# Patient Record
Sex: Male | Born: 1970 | Race: White | Hispanic: No | Marital: Married | Smoking: Never smoker
Health system: Southern US, Community
[De-identification: ages and names within clinical notes are randomized; demographics above are authoritative.]

---

## 2017-02-24 ENCOUNTER — Ambulatory Visit (INDEPENDENT_AMBULATORY_CARE_PROVIDER_SITE_OTHER): Payer: 59 | Admitting: Podiatry

## 2017-02-24 ENCOUNTER — Ambulatory Visit (INDEPENDENT_AMBULATORY_CARE_PROVIDER_SITE_OTHER): Payer: 59

## 2017-02-24 DIAGNOSIS — S99929A Unspecified injury of unspecified foot, initial encounter: Secondary | ICD-10-CM

## 2017-02-24 NOTE — Progress Notes (Signed)
   Subjective:    Patient ID: Trevor RingerKarl Martin, male    DOB: [birth year], 47 y.o.   MRN: [id number]  HPI    Review of Systems  All other systems reviewed and are negative.      Objective:   Physical Exam        Assessment & Plan:

## 2017-02-26 NOTE — Progress Notes (Signed)
   HPI: 47 year old male presenting today as a new patient with a chief complaint of pain to the left forefoot that began earlier today. He reports associated swelling to the 2nd and 3rd toes of the left foot. He states it feels as if he is walking on a marble. He has not done anything to treat the symptoms. Patient is here for further evaluation and treatment.   No past medical history on file.    Physical Exam: General: The patient is alert and oriented x3 in no acute distress.  Dermatology: Skin is warm, dry and supple bilateral lower extremities. Negative for open lesions or macerations.  Vascular: Palpable pedal pulses bilaterally. No edema or erythema noted. Capillary refill within normal limits.  Neurological: Epicritic and protective threshold grossly intact bilaterally.   Musculoskeletal Exam: Sharp pain with palpation of the 2nd interspace and lateral compression of the metatarsal heads consistent with neuroma.  Positive Conley Canal sign with loadbearing of the forefoot.  Radiographic Exam:  Normal osseous mineralization. Joint spaces preserved. No fracture/dislocation/boney destruction.    Assessment: 1.  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 Patient was evaluated. X-Rays reviewed. 2. Patient declined injection today. 3. Met pads dispensed.  4. Recommended OTC Motrin as needed.  5. Recommended good shoe gear. 6. Return to clinic as needed.    Edrick Kins, DPM [facility]  Dr. Edrick Kins, [address]                                        [place], [place] [address]                Office [phone]  Fax [phone]

## 2017-03-03 ENCOUNTER — Ambulatory Visit: Payer: Self-pay | Admitting: Podiatry

## 2019-09-20 ENCOUNTER — Other Ambulatory Visit: Payer: Self-pay

## 2019-09-20 ENCOUNTER — Emergency Department (HOSPITAL_BASED_OUTPATIENT_CLINIC_OR_DEPARTMENT_OTHER): Payer: No Typology Code available for payment source

## 2019-09-20 ENCOUNTER — Emergency Department (HOSPITAL_BASED_OUTPATIENT_CLINIC_OR_DEPARTMENT_OTHER)
Admission: EM | Admit: 2019-09-20 | Discharge: 2019-09-20 | Disposition: A | Discharge status: Home / Self Care | Payer: No Typology Code available for payment source | Attending: Emergency Medicine | Admitting: Emergency Medicine

## 2019-09-20 ENCOUNTER — Encounter (HOSPITAL_BASED_OUTPATIENT_CLINIC_OR_DEPARTMENT_OTHER): Payer: Self-pay | Admitting: Emergency Medicine

## 2019-09-20 DIAGNOSIS — R0789 Other chest pain: Secondary | ICD-10-CM | POA: Diagnosis not present

## 2019-09-20 DIAGNOSIS — R0602 Shortness of breath: Secondary | ICD-10-CM | POA: Insufficient documentation

## 2019-09-20 DIAGNOSIS — R079 Chest pain, unspecified: Secondary | ICD-10-CM | POA: Diagnosis present

## 2019-09-20 LAB — CBC
HCT: 44.8 % (ref 39.0–52.0)
Hemoglobin: 15.5 g/dL (ref 13.0–17.0)
MCH: 31.2 pg (ref 26.0–34.0)
MCHC: 34.6 g/dL (ref 30.0–36.0)
MCV: 90.1 fL (ref 80.0–100.0)
Platelets: 190 10*3/uL (ref 150–400)
RBC: 4.97 MIL/uL (ref 4.22–5.81)
RDW: 12.1 % (ref 11.5–15.5)
WBC: 6.8 10*3/uL (ref 4.0–10.5)
nRBC: 0 % (ref 0.0–0.2)

## 2019-09-20 LAB — BASIC METABOLIC PANEL
Anion gap: 12 (ref 5–15)
BUN: 16 mg/dL (ref 6–20)
CO2: 23 mmol/L (ref 22–32)
Calcium: 9.1 mg/dL (ref 8.9–10.3)
Chloride: 102 mmol/L (ref 98–111)
Creatinine, Ser: 0.78 mg/dL (ref 0.61–1.24)
GFR calc Af Amer: >60 mL/min (ref >60)
GFR calc non Af Amer: >60 mL/min (ref >60)
Glucose, Bld: 97 mg/dL (ref 70–99)
Potassium: 3.5 mmol/L (ref 3.5–5.1)
Sodium: 137 mmol/L (ref 135–145)

## 2019-09-20 LAB — D-DIMER, QUANTITATIVE: D-Dimer, Quant: <0.27 ug/mL-FEU (ref 0.00–0.50)

## 2019-09-20 LAB — TROPONIN I (HIGH SENSITIVITY)
Troponin I (High Sensitivity): 3 ng/L (ref <18)
Troponin I (High Sensitivity): 2 ng/L (ref <18)

## 2019-09-20 NOTE — ED Provider Notes (Signed)
[facility] EMERGENCY DEPARTMENT Provider Note   CSN: [id number] Arrival date & time: 09/20/19  0134     History Chief Complaint  Patient presents with  . Chest Pain    Trevor Martin is a 49 y.o. male.  HPI  HPI: A 49 year old patient with a history of obesity presents for evaluation of chest pain. Initial onset of pain was approximately 1-3 hours ago. The patient's chest pain is described as heaviness/pressure/tightness and is not worse with exertion. The patient's chest pain is not middle- or left-sided, is not well-localized, is not sharp and does not radiate to the arms/jaw/neck. The patient does not complain of nausea and denies diaphoresis. The patient has no history of stroke, has no history of peripheral artery disease, has not smoked in the past 90 days, denies any history of treated diabetes, has no relevant family history of coronary artery disease (first degree relative at less than age 97), is not hypertensive and has no history of hypercholesterolemia.   This a 49 year old male with no reported past medical history who presents with chest discomfort.  Patient reports onset of symptoms on Saturday.  He has had waxing and waning chest discomfort and heaviness.  He describes it as "not feeling like I can catch my breath well."  Denies any association with exertion or food intake.  Denies any recent fevers, cough, upper respiratory symptoms.  No known sick contacts or Covid exposures.  He is not vaccinated against COVID-19.  Denies history of blood clots or lower extremity swelling.  No recent travel or hospitalization.  Currently he is not having any discomfort.  History reviewed. No pertinent past medical history.  There are no problems to display for this patient.   History reviewed. No pertinent surgical history.     No family history on file.  Social History   Tobacco Use  . Smoking status: Never Smoker  . Smokeless tobacco: Never Used  Substance Use  Topics  . Alcohol use: Never  . Drug use: Never    Home Medications Prior to Admission medications   Not on File    Allergies    Patient has no allergy information on record.  Review of Systems   Review of Systems  Constitutional: Negative for fever.  Respiratory: Positive for shortness of breath. Negative for cough.   Cardiovascular: Positive for chest pain. Negative for leg swelling.  Gastrointestinal: Negative for abdominal pain, nausea and vomiting.  Genitourinary: Negative for dysuria.  All other systems reviewed and are negative.   Physical Exam Updated Vital Signs BP 125/80 (BP Location: Right Arm)   Pulse 63   Temp 97.6 F (36.4 C) (Oral)   Resp 18   Ht 1.829 m (6')   Wt 109.8 kg   SpO2 98%   BMI 32.82 kg/m   Physical Exam Vitals and nursing note reviewed.  Constitutional:      Appearance: He is well-developed. He is obese.  HENT:     Head: Normocephalic and atraumatic.  Eyes:     Pupils: Pupils are equal, round, and reactive to light.  Cardiovascular:     Rate and Rhythm: Normal rate and regular rhythm.     Heart sounds: Normal heart sounds. No murmur heard.   Pulmonary:     Effort: Pulmonary effort is normal. No respiratory distress.     Breath sounds: Normal breath sounds. No wheezing.  Abdominal:     General: Bowel sounds are normal.     Palpations: Abdomen is soft.  Tenderness: There is no abdominal tenderness. There is no rebound.  Musculoskeletal:     Cervical back: Neck supple.  Lymphadenopathy:     Cervical: No cervical adenopathy.  Skin:    General: Skin is warm and dry.  Neurological:     Mental Status: He is alert and oriented to person, place, and time.     ED Results / Procedures / Treatments   Labs (all labs ordered are listed, but only abnormal results are displayed) Labs Reviewed  BASIC METABOLIC PANEL  CBC  D-DIMER, QUANTITATIVE (NOT AT [facility])  TROPONIN I (HIGH SENSITIVITY)  TROPONIN I (HIGH SENSITIVITY)     EKG EKG Interpretation  Date/Time:  Monday September 20 2019 01:44:13 EDT Ventricular Rate:  71 PR Interval:  142 QRS Duration: 92 QT Interval:  400 QTC Calculation: 434 R Axis:   -24 Text Interpretation: Normal sinus rhythm Normal ECG Confirmed by Ross Marcus ([id number]) on 09/20/2019 3:13:42 AM   Radiology DG Chest 2 View  Result Date: 09/20/2019 CLINICAL DATA:  Chest pain. EXAM: CHEST - 2 VIEW COMPARISON:  None. FINDINGS: There is no evidence of acute infiltrate, pleural effusion or pneumothorax. A 5 mm calcified lung nodule is seen within the left apex. The heart size and mediastinal contours are within normal limits. The visualized skeletal structures are unremarkable. IMPRESSION: No active cardiopulmonary disease. Electronically Signed   By: Aram Candela M.D.   On: 09/20/2019 02:38    Procedures Procedures (including critical care time)  Medications Ordered in ED Medications - No data to display  ED Course  I have reviewed the triage vital signs and the nursing notes.  Pertinent labs & imaging results that were available during my care of the patient were reviewed by me and considered in my medical decision making (see chart for details).    MDM Rules/Calculators/A&P HEAR Score: 2                         Patient presents with chest discomfort.  He is overall nontoxic and vital signs are reassuring.  EKG shows no acute ischemic changes.  Pain is very atypical for ACS and is not exertional.  Heart score is 2.  Troponin profile is low risk.  Chest x-ray obtained and shows no evidence of pneumothorax or pneumonia.  Screening D-dimer was sent to evaluate for PE and this is within normal range.  Doubt PE.  Given prevalence of COVID-19, offered COVID-19 testing although he denies any infectious symptoms.  He has declined.  I did encourage him to consider vaccination.  Patient has remained relatively active symptomatic in the emergency department.  Recommend follow-up with  cardiology as an outpatient.  After history, exam, and medical workup I feel the patient has been appropriately medically screened and is safe for discharge home. Pertinent diagnoses were discussed with the patient. Patient was given return precautions.   Final Clinical Impression(s) / ED Diagnoses Final diagnoses:  Atypical chest pain    Rx / DC Orders ED Discharge Orders    None       Odes Lolli, Mayer Masker, MD 09/20/19 [phone]

## 2019-09-20 NOTE — ED Triage Notes (Signed)
Reports central chest pressure that started Saturday. Also endorses SOB.

## 2019-09-20 NOTE — Discharge Instructions (Addendum)
You were seen today for chest pain.  Your work-up is reassuring.  Follow-up with cardiology for definitive stress testing and further evaluation.  I encourage you to consider COVID-19 vaccination given prevalence of COVID-19 in the community.

## 2019-10-04 ENCOUNTER — Ambulatory Visit: Payer: No Typology Code available for payment source | Admitting: Family Medicine

## 2019-10-05 ENCOUNTER — Ambulatory Visit (INDEPENDENT_AMBULATORY_CARE_PROVIDER_SITE_OTHER): Payer: No Typology Code available for payment source | Admitting: Family Medicine

## 2019-10-05 ENCOUNTER — Encounter: Payer: Self-pay | Admitting: Family Medicine

## 2019-10-05 ENCOUNTER — Other Ambulatory Visit: Payer: Self-pay

## 2019-10-05 DIAGNOSIS — M25552 Pain in left hip: Secondary | ICD-10-CM

## 2019-10-05 DIAGNOSIS — M25562 Pain in left knee: Secondary | ICD-10-CM

## 2019-10-05 NOTE — Progress Notes (Signed)
Office Visit Note   Patient: Trevor Martin           Date of Birth: [birth year]           MRN: [id number] Visit Date: 10/05/2019 Requested by: No referring provider defined for this encounter. PCP: Patient, No Pcp Per  Subjective: Chief Complaint  Patient presents with  . Left Knee - Pain    Intermittent pain since July of this year - hurts especially with running. Can feel "cracking" inside the knee - does not hurt when it cracks. Pain anterior and slightly medial. Also, has left gluteal pain when running.    HPI: He is here with left knee pain.  2 or 3 years ago he recalls running and "tweaking" his knee causing him to stop running.  Around the same time he was moving to a new house, so he basically rested from running for about 3 months.  Eventually his knee felt better and he was able to resume running again but then this past July without injury, he started noticing increasing popping in his knee.  It occurs when running, and when standing up from seated position.  He does not feel a lot of pain with it, he denies any locking or giving way.  He has not done any specific treatment since it does not hurt.  He has otherwise been in good health.  Denies any back pain, but he does note some left posterior hip pain intermittently in the past couple months without radicular symptoms.               ROS:   All other systems were reviewed and are negative.  Objective: Vital Signs: There were no vitals taken for this visit.  Physical Exam:  General:  Alert and oriented, in no acute distress. Pulm:  Breathing unlabored. Psy:  Normal mood, congruent affect.  Left hip: He is slightly tender in the posterior hip along the course of the piriformis.  He has pain with piriformis stretch.  No pain with passive internal hip rotation. Left knee: Trace effusion with no warmth.  He has a palpable click in the patellofemoral joint at about 60 degrees flexion.  Lachman's is solid, no laxity with varus  or valgus stress.  He is mildly tender on the lateral joint line and has pain with a palpable click on McMurray's.  No significant medial tenderness.   Imaging: None today  Assessment & Plan: 1.  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 -We will try glucosamine, turmeric, he will wear good fitting shoes and avoid running when limping.  He will try to cross train using low impact aerobic activities. -If symptoms worsen, then we will obtain three-view knee x-rays and possibly MRI scan.  2.  Left hip pain, suspect piriformis strain secondary to altered gait -Stretching at home.  Physical therapy if worsens.     Procedures: No procedures performed  No notes on file     PMFS History: There are no problems to display for this patient.  History reviewed. No pertinent past medical history.  History reviewed. No pertinent family history.  History reviewed. No pertinent surgical history. Social History   Occupational History  . Not on file  Tobacco Use  . Smoking status: Never Smoker  . Smokeless tobacco: Never Used  Substance and Sexual Activity  . Alcohol use: Never  . Drug use: Never  . Sexual activity: Not on file

## 2019-10-05 NOTE — Patient Instructions (Signed)
    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   Treatment:  - Good shoes - No running when limping. - Flatter terrain is better - Low impact aerobic exercise  - Glucosamine sulfate 1,000-1,500 mg twice daily  - Turmeric 500 mg twice daily

## 2019-10-22 ENCOUNTER — Telehealth: Payer: Self-pay

## 2019-10-22 NOTE — Telephone Encounter (Signed)
Holding for Terri in case there is a cancellation and sooner appt available.

## 2019-10-22 NOTE — Telephone Encounter (Signed)
Patient called in to sch appt for physical . Also wanted to be put on cancellation list if anybody cancels sooner than 10/22 and also wants a 8:20 appt time

## 2019-10-25 NOTE — Telephone Encounter (Signed)
I called the patient, as we had had a cancellation for the 8:20 slot the day before his current appointment. He actually meant that he wanted an earlier appointment on that same day (the 22nd) - Fridays work best for him. Physicals are only at 8:20 or 1:00. Unfortunately, the morning appointments are booked, so the patient will still come in at 1:00.

## 2019-10-28 ENCOUNTER — Encounter: Payer: No Typology Code available for payment source | Admitting: Family Medicine

## 2019-10-29 ENCOUNTER — Telehealth: Payer: Self-pay | Admitting: Family Medicine

## 2019-10-29 ENCOUNTER — Encounter: Payer: Self-pay | Admitting: Family Medicine

## 2019-10-29 ENCOUNTER — Other Ambulatory Visit: Payer: Self-pay

## 2019-10-29 ENCOUNTER — Ambulatory Visit (INDEPENDENT_AMBULATORY_CARE_PROVIDER_SITE_OTHER): Payer: No Typology Code available for payment source | Admitting: Family Medicine

## 2019-10-29 VITALS — BP 127/77 | HR 83 | Ht 72.0 in | Wt 254.2 lb

## 2019-10-29 DIAGNOSIS — Z6834 Body mass index (BMI) 34.0-34.9, adult: Secondary | ICD-10-CM

## 2019-10-29 DIAGNOSIS — E6609 Other obesity due to excess calories: Secondary | ICD-10-CM | POA: Insufficient documentation

## 2019-10-29 DIAGNOSIS — Z0000 Encounter for general adult medical examination without abnormal findings: Secondary | ICD-10-CM

## 2019-10-29 NOTE — Telephone Encounter (Signed)
[facility] -- left this info on the patient's voice mail.

## 2019-10-29 NOTE — Progress Notes (Signed)
Office Visit Note   Patient: Trevor Martin           Date of Birth: [birth year]           MRN: [id number] Visit Date: 10/29/2019 Requested by: No referring provider defined for this encounter. PCP: Patient, No Pcp Per  Subjective: Chief Complaint  Patient presents with  . Annual Exam    HPI: He is here for a wellness exam.  He is feeling well, but it has been several years since he had one.  He has no specific complaints or concerns other than the fact that he wants to lose weight.  Although he does not eat much in the way of processed foods, his weakness is eating baked goods.  Generally everything is made in the home because his son has inflammatory bowel disease and follows the specific carbohydrate diet.  He gets occasional pain and popping in his shoulder and his knee but otherwise feels well.  Eye exam and dental exams are up-to-date.  He has not had colonoscopy.  Family history updated in detail.  His son has inflammatory bowel disease.                ROS: Energy level is good.  Bowel function is normal.  All other systems were reviewed and are negative.  Objective: Vital Signs: BP 127/77   Pulse 83   Ht 6' (1.829 m)   Wt 254 lb 3.2 oz (115.3 kg)   BMI 34.48 kg/m   Physical Exam:  General:  Alert and oriented, in no acute distress. Pulm:  Breathing unlabored. Psy:  Normal mood, congruent affect. Skin: Multiple benign-appearing nevi on his trunk. HEENT:  [place]/AT, PERRLA, EOM Full, no nystagmus.  Funduscopic examination within normal limits.  No conjunctival erythema.  Tympanic membranes are pearly gray with normal landmarks.  External ear canals are normal.  Nasal passages are clear.  Oropharynx is clear.  No significant lymphadenopathy.  No thyromegaly or nodules.  2+ carotid pulses without bruits. CV: Regular rate and rhythm without murmurs, rubs, or gallops.  No peripheral edema.  2+ radial and posterior tibial pulses. Lungs: Clear to auscultation throughout  with no wheezing or areas of consolidation. Abd: Bowel sounds are active, no hepatosplenomegaly or masses.  Soft and nontender.  No audible bruits.  No evidence of ascites.    Imaging: No results found.  Assessment & Plan: 1.  Wellness examination -Labs to evaluate. -Cologuard at age 49.  2.  Obesity - Cardiometabolic food plan given. - Health coach information given.         Procedures: No procedures performed  No notes on file     PMFS History: There are no problems to display for this patient.  History reviewed. No pertinent past medical history.  Family History  Problem Relation Age of Onset  . Healthy Mother   . Arthritis Mother        OA  . Arthritis Father        OA  . Hypertension Father   . Healthy Sister   . Hypertension Brother   . Heart disease Maternal Grandfather   . Healthy Paternal Grandmother   . Cancer Paternal Grandfather        Possible pancreatic  . Healthy Brother   . Colon cancer Neg Hx   . Prostate cancer Neg Hx   . Diabetes Neg Hx   . Stroke Neg Hx   . Alzheimer's disease Neg Hx     History reviewed.  No pertinent surgical history. Social History   Occupational History  . Not on file  Tobacco Use  . Smoking status: Never Smoker  . Smokeless tobacco: Never Used  Substance and Sexual Activity  . Alcohol use: Never  . Drug use: Never  . Sexual activity: Not on file

## 2019-10-29 NOTE — Telephone Encounter (Signed)
Patient called and wants to know the name of the company we use to process blood work. Please call patient at [phone].

## 2019-10-30 LAB — COMPREHENSIVE METABOLIC PANEL
AG Ratio: 1.7 (calc) (ref 1.0–2.5)
ALT: 31 U/L (ref 9–46)
AST: 15 U/L (ref 10–40)
Albumin: 4.5 g/dL (ref 3.6–5.1)
Alkaline phosphatase (APISO): 84 U/L (ref 36–130)
BUN: 16 mg/dL (ref 7–25)
CO2: 28 mmol/L (ref 20–32)
Calcium: 9.9 mg/dL (ref 8.6–10.3)
Chloride: 102 mmol/L (ref 98–110)
Creat: 0.86 mg/dL (ref 0.60–1.35)
Globulin: 2.6 g/dL (calc) (ref 1.9–3.7)
Glucose, Bld: 85 mg/dL (ref 65–99)
Potassium: 4.6 mmol/L (ref 3.5–5.3)
Sodium: 139 mmol/L (ref 135–146)
Total Bilirubin: 0.7 mg/dL (ref 0.2–1.2)
Total Protein: 7.1 g/dL (ref 6.1–8.1)

## 2019-10-30 LAB — CBC WITH DIFFERENTIAL/PLATELET
Absolute Monocytes: 574 cells/uL (ref 200–950)
Basophils Absolute: 33 cells/uL (ref 0–200)
Basophils Relative: 0.4 %
Eosinophils Absolute: 82 cells/uL (ref 15–500)
Eosinophils Relative: 1 %
HCT: 46 % (ref 38.5–50.0)
Hemoglobin: 15.7 g/dL (ref 13.2–17.1)
Lymphs Abs: 2116 cells/uL (ref 850–3900)
MCH: 31.3 pg (ref 27.0–33.0)
MCHC: 34.1 g/dL (ref 32.0–36.0)
MCV: 91.8 fL (ref 80.0–100.0)
MPV: 11.3 fL (ref 7.5–12.5)
Monocytes Relative: 7 %
Neutro Abs: 5396 cells/uL (ref 1500–7800)
Neutrophils Relative %: 65.8 %
Platelets: 230 10*3/uL (ref 140–400)
RBC: 5.01 10*6/uL (ref 4.20–5.80)
RDW: 12.9 % (ref 11.0–15.0)
Total Lymphocyte: 25.8 %
WBC: 8.2 10*3/uL (ref 3.8–10.8)

## 2019-10-30 LAB — VITAMIN D 25 HYDROXY (VIT D DEFICIENCY, FRACTURES): Vit D, 25-Hydroxy: 42 ng/mL (ref 30–100)

## 2019-10-30 LAB — THYROID PANEL WITH TSH
Free Thyroxine Index: 2.1 (ref 1.4–3.8)
T3 Uptake: 35 % (ref 22–35)
T4, Total: 6.1 ug/dL (ref 4.9–10.5)
TSH: 1.61 mIU/L (ref 0.40–4.50)

## 2019-10-30 LAB — LIPID PANEL
Cholesterol: 169 mg/dL (ref <200)
HDL: 47 mg/dL (ref >=40)
LDL Cholesterol (Calc): 85 mg/dL (calc)
Non-HDL Cholesterol (Calc): 122 mg/dL (calc) (ref <130)
Total CHOL/HDL Ratio: 3.6 (calc) (ref <5.0)
Triglycerides: 308 mg/dL — ABNORMAL HIGH (ref <150)

## 2019-10-30 LAB — PSA: PSA: 1.95 ng/mL (ref <=4.0)

## 2019-10-30 LAB — HIGH SENSITIVITY CRP: hs-CRP: 1.2 mg/L

## 2019-11-01 ENCOUNTER — Telehealth: Payer: Self-pay | Admitting: Family Medicine

## 2019-11-01 NOTE — Telephone Encounter (Signed)
Labs show:  Triglycerides are elevated at 308.  This number generally improves with regular exercise and dietary limitation of breads; pastas; cereals; sugars/sweets.  Would suggest rechecking in 6 months to be sure it's normalized.  Prostate PSA is normal at 1.95, but slightly higher than I'd like to see at age 49.  I recommend rechecking in 6 months to ensure stability.  All else looks good.

## 2020-07-17 ENCOUNTER — Telehealth: Payer: Self-pay | Admitting: Family Medicine

## 2020-07-17 ENCOUNTER — Other Ambulatory Visit: Payer: Self-pay

## 2020-07-17 DIAGNOSIS — E559 Vitamin D deficiency, unspecified: Secondary | ICD-10-CM

## 2020-07-17 DIAGNOSIS — R972 Elevated prostate specific antigen [PSA]: Secondary | ICD-10-CM

## 2020-07-17 DIAGNOSIS — Z0000 Encounter for general adult medical examination without abnormal findings: Secondary | ICD-10-CM

## 2020-07-17 DIAGNOSIS — E781 Pure hyperglyceridemia: Secondary | ICD-10-CM

## 2020-07-17 NOTE — Telephone Encounter (Signed)
Left  voice message to call back or send a message through MyChart, with specific information.

## 2020-07-17 NOTE — Telephone Encounter (Signed)
Patient would like to get the PSA and lipid panel rechecked as planned, but at [facility], and also add vitamin D - wife says they just want to keep a check on it. Ok to add vitamin D?

## 2020-07-17 NOTE — Telephone Encounter (Signed)
Wife called. She would like Terri to call her. [phone]

## 2020-07-17 NOTE — Telephone Encounter (Signed)
Printed off the lab order and emailed it to the patient, [email or web address].

## 2020-07-17 NOTE — Telephone Encounter (Signed)
Patient's wife Pam returned call asked for a call back concerning his lab work. The number to contact Pam is [phone]

## 2020-07-17 NOTE — Telephone Encounter (Signed)
Duplicate message from today.

## 2020-07-18 ENCOUNTER — Telehealth: Payer: Self-pay | Admitting: Family Medicine

## 2020-07-18 NOTE — Telephone Encounter (Signed)
Pt called stating he needs to schedule a blood test early in the morning and would like a CB from [place]   [phone]

## 2020-07-18 NOTE — Telephone Encounter (Signed)
Patient would like to come here for labs instead of going to [facility]. Appointment scheduled for 07/25/20 at 8:15.

## 2020-07-20 ENCOUNTER — Other Ambulatory Visit: Payer: Self-pay

## 2020-07-20 DIAGNOSIS — E559 Vitamin D deficiency, unspecified: Secondary | ICD-10-CM

## 2020-07-20 DIAGNOSIS — R972 Elevated prostate specific antigen [PSA]: Secondary | ICD-10-CM

## 2020-07-20 DIAGNOSIS — E781 Pure hyperglyceridemia: Secondary | ICD-10-CM

## 2020-07-25 ENCOUNTER — Ambulatory Visit: Payer: No Typology Code available for payment source

## 2020-07-27 ENCOUNTER — Telehealth: Payer: Self-pay | Admitting: Family Medicine

## 2020-07-27 LAB — LIPID PANEL
Chol/HDL Ratio: 3.1 ratio (ref 0.0–5.0)
Cholesterol, Total: 142 mg/dL (ref 100–199)
HDL: 46 mg/dL (ref >39)
LDL Chol Calc (NIH): 78 mg/dL (ref 0–99)
Triglycerides: 95 mg/dL (ref 0–149)
VLDL Cholesterol Cal: 18 mg/dL (ref 5–40)

## 2020-07-27 LAB — VITAMIN D 25 HYDROXY (VIT D DEFICIENCY, FRACTURES): Vit D, 25-Hydroxy: 48.9 ng/mL (ref 30.0–100.0)

## 2020-07-27 LAB — PSA: Prostate Specific Ag, Serum: 2.5 ng/mL (ref 0.0–4.0)

## 2020-07-27 NOTE — Telephone Encounter (Signed)
Labs are all in normal range, but I would expect your PSA (prostate test) to be closer to 1.0 or lower.  I recommend having this rechecked in about 6 months.

## 2021-02-02 ENCOUNTER — Telehealth: Payer: Self-pay | Admitting: Family Medicine

## 2021-02-02 NOTE — Telephone Encounter (Signed)
Medical records emailed to [facility]. 

## 2021-11-05 IMAGING — CR DG CHEST 2V
2 series · 2 of 2 positions shown · non-contrast
Comparison: None.

CLINICAL DATA: Chest pain.

EXAM:
CHEST - 2 VIEW

[w chest pa]
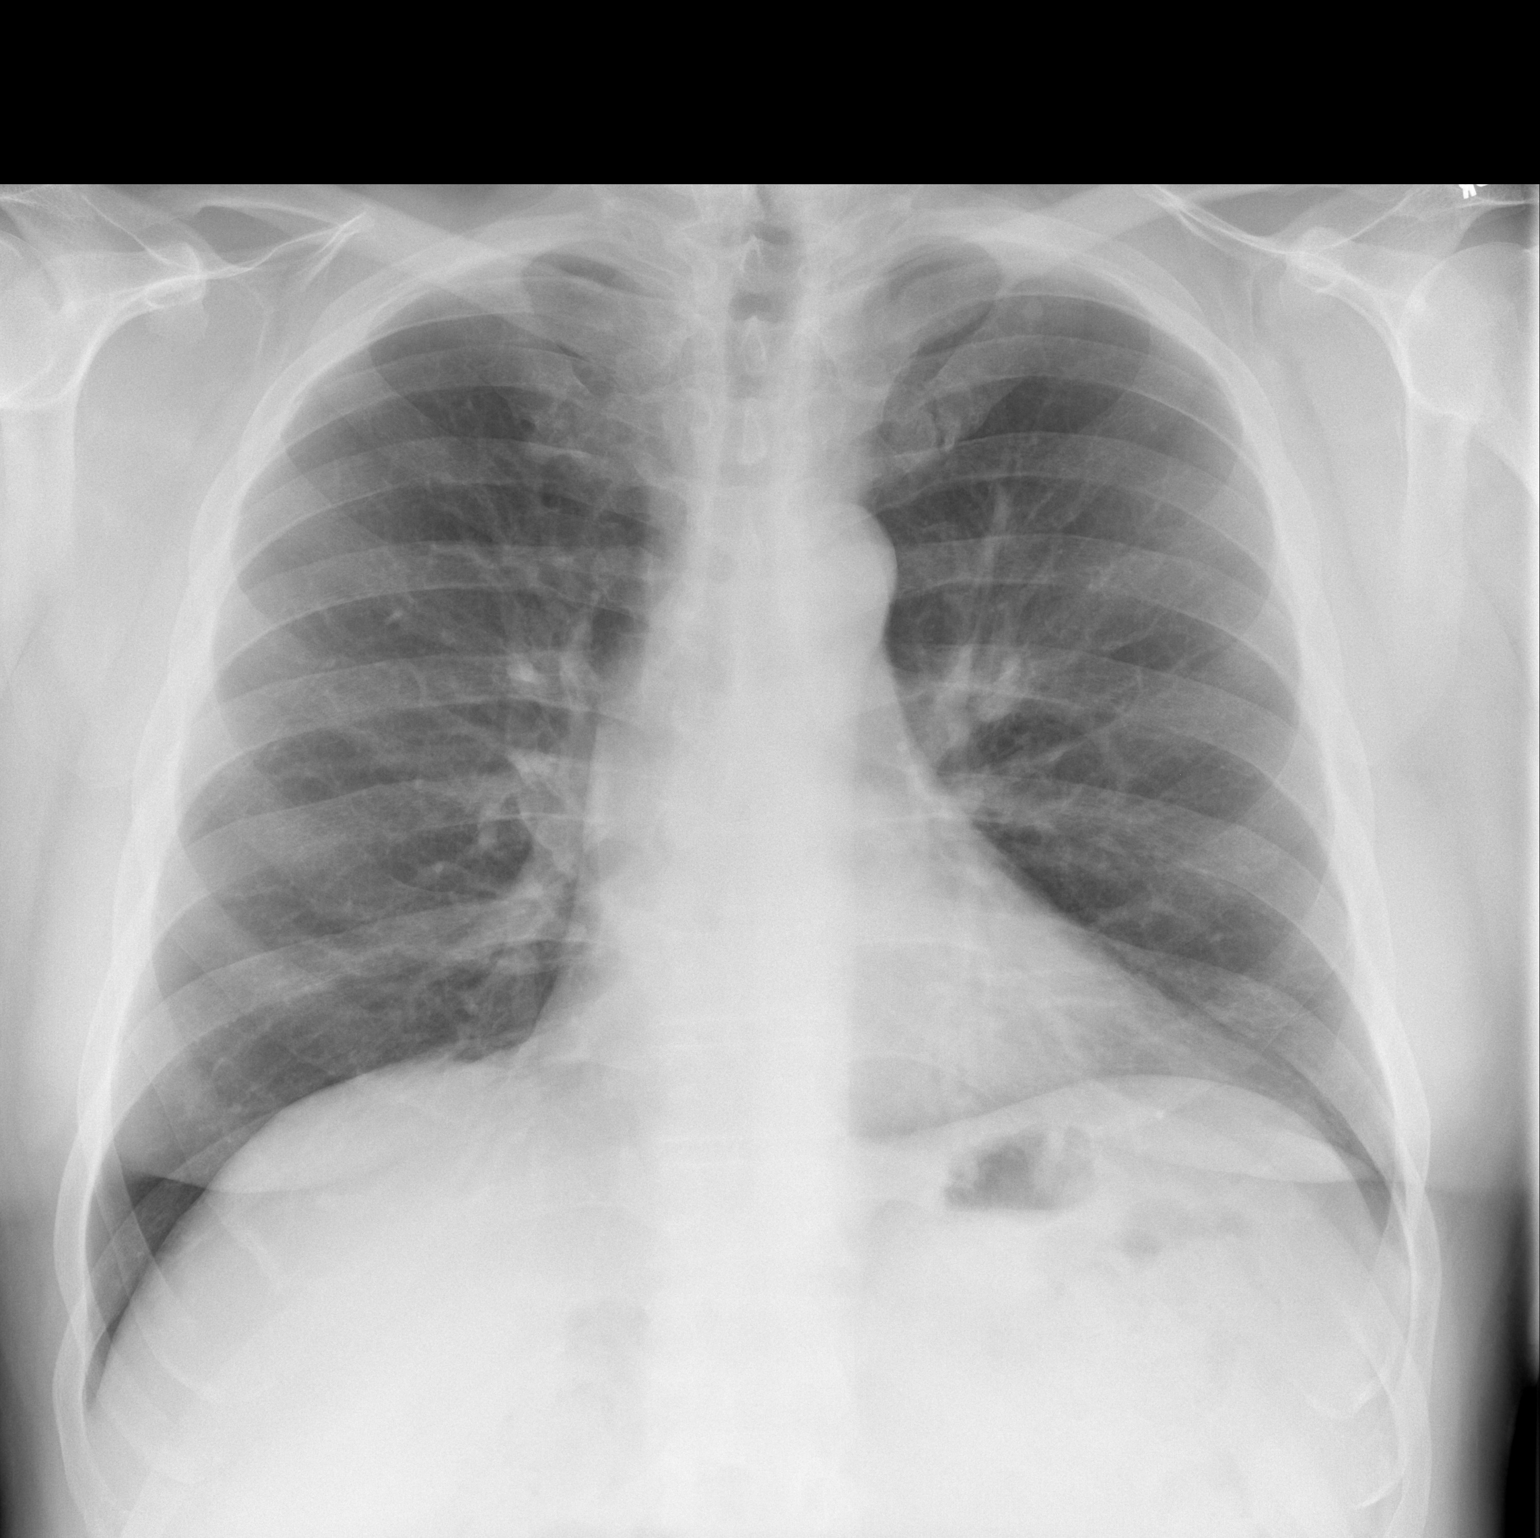

[w chest lat]
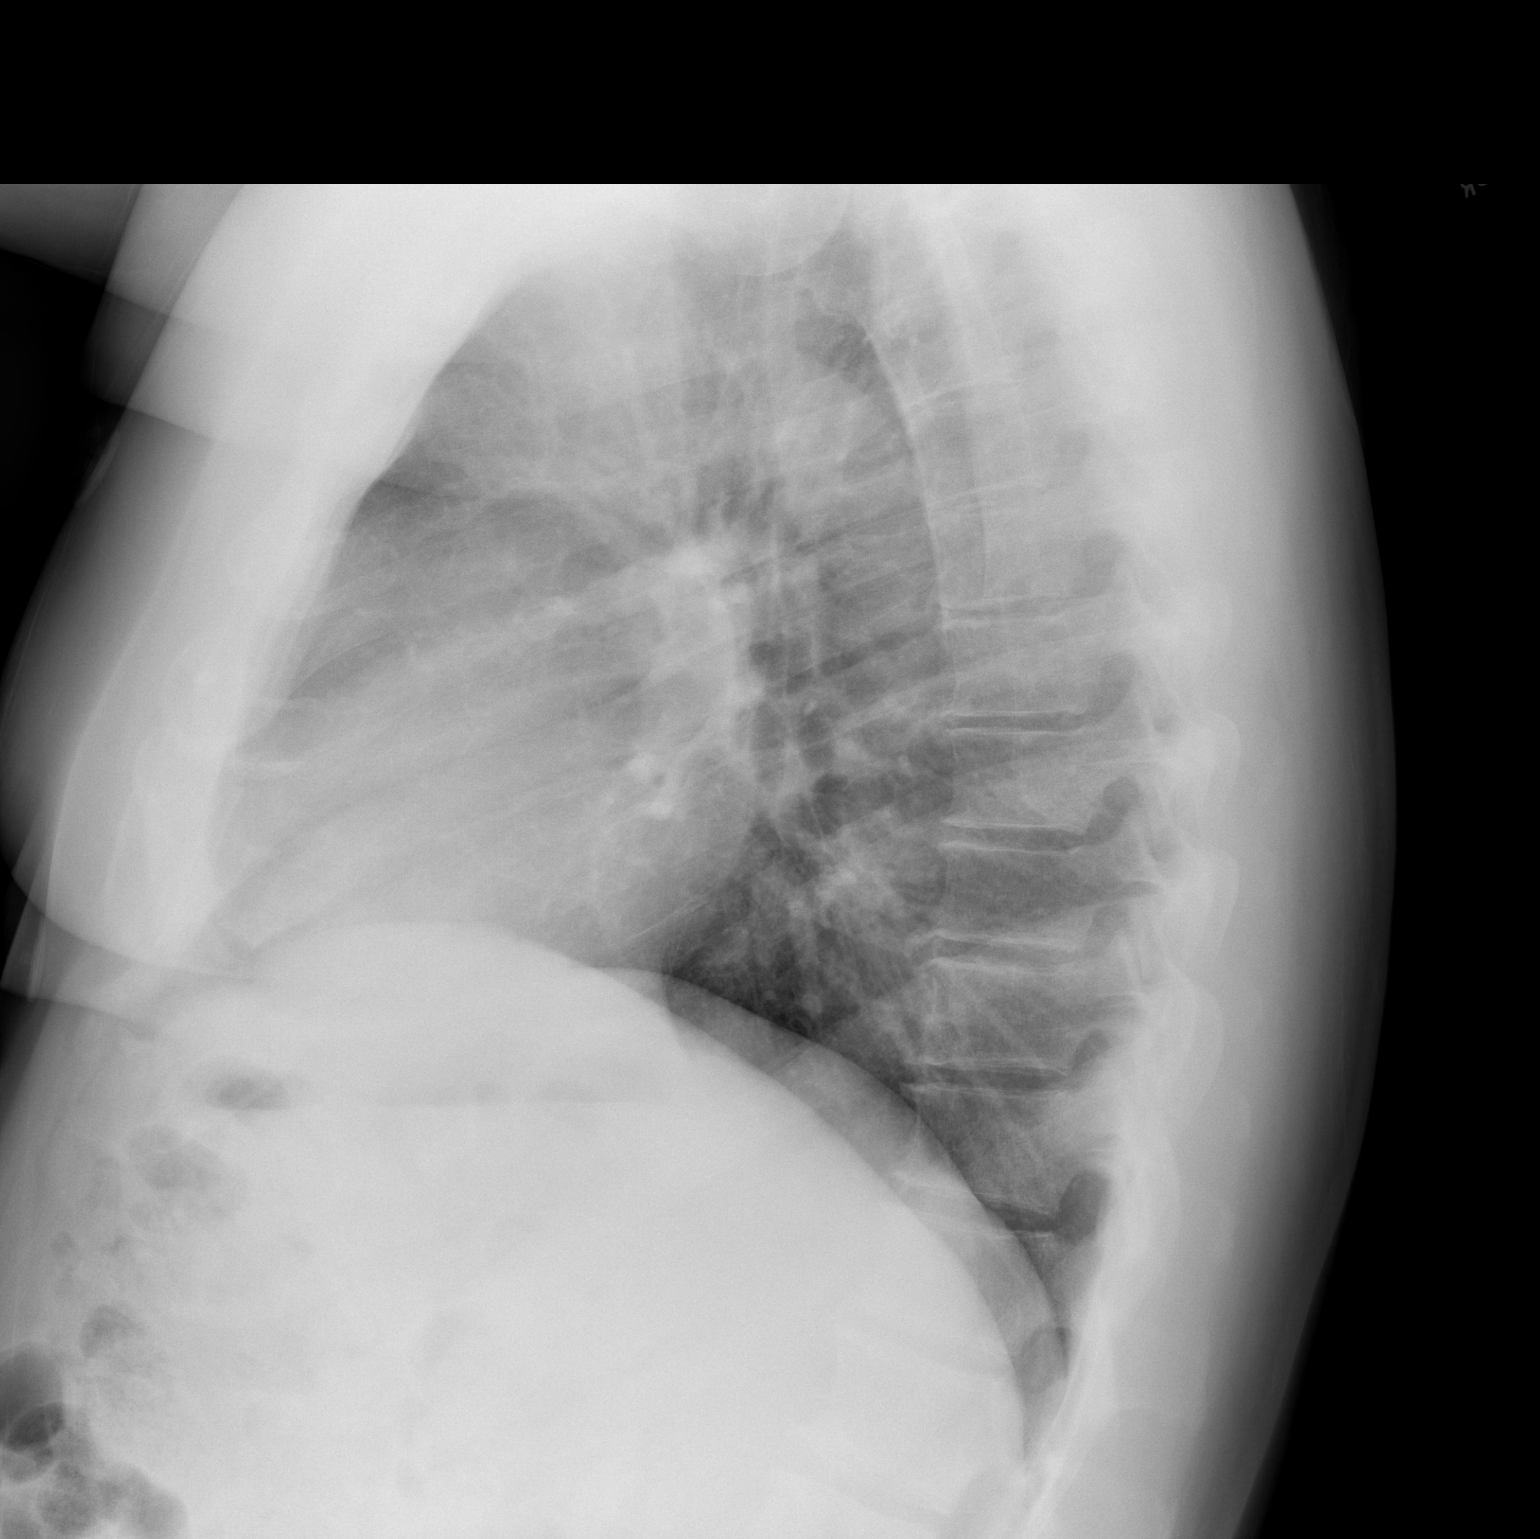

[2 of 2 positions shown; findings below may reference images not displayed]

FINDINGS: There is no evidence of acute infiltrate, pleural effusion or
pneumothorax. A 5 mm calcified lung nodule is seen within the left
apex. The heart size and mediastinal contours are within normal
limits. The visualized skeletal structures are unremarkable.
IMPRESSION: No active cardiopulmonary disease.
